# Patient Record
Sex: Male | Born: 1950 | Race: White | Hispanic: No | Marital: Married | Smoking: Never smoker
Health system: Southern US, Community
[De-identification: ages and names within clinical notes are randomized; demographics above are authoritative.]

## PROBLEM LIST (undated history)

## (undated) DIAGNOSIS — N200 Calculus of kidney: Secondary | ICD-10-CM

## (undated) DIAGNOSIS — I10 Essential (primary) hypertension: Secondary | ICD-10-CM

---

## 2007-12-10 ENCOUNTER — Encounter: Admission: RE | Admit: 2007-12-10 | Discharge: 2007-12-10 | Payer: Self-pay | Admitting: Orthopedic Surgery

## 2008-04-30 ENCOUNTER — Encounter: Admission: RE | Admit: 2008-04-30 | Discharge: 2008-04-30 | Payer: Self-pay | Admitting: Orthopedic Surgery

## 2009-04-29 ENCOUNTER — Encounter: Admission: RE | Admit: 2009-04-29 | Discharge: 2009-04-29 | Payer: Self-pay | Admitting: Urology

## 2011-03-08 ENCOUNTER — Other Ambulatory Visit: Payer: Self-pay | Admitting: Internal Medicine

## 2011-03-08 DIAGNOSIS — N281 Cyst of kidney, acquired: Secondary | ICD-10-CM

## 2011-05-03 ENCOUNTER — Ambulatory Visit
Admission: RE | Admit: 2011-05-03 | Discharge: 2011-05-03 | Disposition: A | Discharge status: Home / Self Care | Payer: Managed Care, Other (non HMO) | Source: Ambulatory Visit | Attending: Internal Medicine | Admitting: Internal Medicine

## 2011-05-03 DIAGNOSIS — N281 Cyst of kidney, acquired: Secondary | ICD-10-CM

## 2013-01-04 ENCOUNTER — Emergency Department (HOSPITAL_COMMUNITY)
Admission: EM | Admit: 2013-01-04 | Discharge: 2013-01-04 | Disposition: A | Discharge status: Home Health | Payer: Managed Care, Other (non HMO) | Attending: Emergency Medicine | Admitting: Emergency Medicine

## 2013-01-04 ENCOUNTER — Encounter (HOSPITAL_COMMUNITY): Payer: Self-pay | Admitting: Nurse Practitioner

## 2013-01-04 DIAGNOSIS — R110 Nausea: Secondary | ICD-10-CM | POA: Insufficient documentation

## 2013-01-04 DIAGNOSIS — N200 Calculus of kidney: Secondary | ICD-10-CM

## 2013-01-04 DIAGNOSIS — R6883 Chills (without fever): Secondary | ICD-10-CM | POA: Insufficient documentation

## 2013-01-04 DIAGNOSIS — R34 Anuria and oliguria: Secondary | ICD-10-CM | POA: Insufficient documentation

## 2013-01-04 DIAGNOSIS — M549 Dorsalgia, unspecified: Secondary | ICD-10-CM | POA: Insufficient documentation

## 2013-01-04 DIAGNOSIS — R319 Hematuria, unspecified: Secondary | ICD-10-CM

## 2013-01-04 DIAGNOSIS — I10 Essential (primary) hypertension: Secondary | ICD-10-CM | POA: Insufficient documentation

## 2013-01-04 HISTORY — DX: Calculus of kidney: N20.0

## 2013-01-04 HISTORY — DX: Essential (primary) hypertension: I10

## 2013-01-04 LAB — CBC WITH DIFFERENTIAL/PLATELET
Basophils Absolute: 0 10*3/uL (ref 0.0–0.1)
Eosinophils Absolute: 0 10*3/uL (ref 0.0–0.7)
Eosinophils Relative: 1 % (ref 0–5)
HCT: 41.4 % (ref 39.0–52.0)
Lymphocytes Relative: 20 % (ref 12–46)
Lymphs Abs: 1.2 10*3/uL (ref 0.7–4.0)
MCH: 30.5 pg (ref 26.0–34.0)
MCV: 87.2 fL (ref 78.0–100.0)
Monocytes Absolute: 0.5 10*3/uL (ref 0.1–1.0)
RDW: 13.4 % (ref 11.5–15.5)
WBC: 6.1 10*3/uL (ref 4.0–10.5)

## 2013-01-04 LAB — URINALYSIS, MICROSCOPIC ONLY
Nitrite: NEGATIVE
Protein, ur: NEGATIVE mg/dL
Urobilinogen, UA: 0.2 mg/dL (ref 0.0–1.0)

## 2013-01-04 LAB — COMPREHENSIVE METABOLIC PANEL
CO2: 24 mEq/L (ref 19–32)
Calcium: 9.6 mg/dL (ref 8.4–10.5)
Creatinine, Ser: 0.88 mg/dL (ref 0.50–1.35)
GFR calc Af Amer: >90 mL/min (ref >90)
GFR calc non Af Amer: >90 mL/min (ref >90)
Glucose, Bld: 138 mg/dL — ABNORMAL HIGH (ref 70–99)
Total Protein: 7.7 g/dL (ref 6.0–8.3)

## 2013-01-04 MED ORDER — OXYCODONE-ACETAMINOPHEN 5-325 MG PO TABS: 1.0000 | ORAL_TABLET | Freq: Four times a day (QID) | ORAL | Status: AC | PRN

## 2013-01-04 MED ORDER — HYDROMORPHONE HCL PF 1 MG/ML IJ SOLN
1.0000 mg | Freq: Once | INTRAMUSCULAR | Status: AC
  Administered 2013-01-04: 1 mg via INTRAVENOUS
  Filled 2013-01-04: qty 1

## 2013-01-04 MED ORDER — SODIUM CHLORIDE 0.9 % IV BOLUS (SEPSIS)
1000.0000 mL | Freq: Once | INTRAVENOUS | Status: AC
  Administered 2013-01-04: 1000 mL via INTRAVENOUS

## 2013-01-04 MED ORDER — ONDANSETRON HCL 4 MG PO TABS: 4.0000 mg | ORAL_TABLET | Freq: Four times a day (QID) | ORAL | Status: AC

## 2013-01-04 MED ORDER — ONDANSETRON HCL 4 MG/2ML IJ SOLN
4.0000 mg | Freq: Once | INTRAMUSCULAR | Status: AC
  Administered 2013-01-04: 4 mg via INTRAVENOUS
  Filled 2013-01-04: qty 2

## 2013-01-04 NOTE — ED Notes (Signed)
Pt woke this am with R flank pain now radiating into RLQ, "Feels like when I had a kidney stone."

## 2013-01-04 NOTE — ED Provider Notes (Signed)
History     CSN: [id number]  Arrival date & time 01/04/13  1001   First MD Initiated Contact with Patient 01/04/13 1008      Chief Complaint  Patient presents with  . Flank Pain    (Consider location/radiation/quality/duration/timing/severity/associated sxs/prior treatment) HPI Comments: 62 y/o male with history of kidney stones, hypertension, and right renal pole cyst presents to the emergency department today with complaints of right mid back pain that awoke him this morning. Pain is radiating around to right flank and down into RLQ and groin region. Pain is waxing and waning, described as sharp and stabbing.  He has not tried anything for the pain and nothing makes it worse.  He has some associated nausea and chills, and reported low volume urine output this am.  Pain is a 6/10 currently, and has been an 8/10 at worst.  Denies fever, vomiting, diarrhea, dysuria, burning with urination, visible hematuria, or testicular tenderness/swelling. Sees urology at [facility], last seen about 6 months ago for his enlarged prostate. Has not had a kidney stone in about 4 years.  Patient is a 62 y.o. male presenting with flank pain. The history is provided by the patient.  Flank Pain Associated symptoms include abdominal pain, chills and nausea. Pertinent negatives include no chest pain, diaphoresis, fever or vomiting.    Past Medical History  Diagnosis Date  . Kidney stone   . Hypertension     History reviewed. No pertinent past surgical history.  History reviewed. No pertinent family history.  History  Substance Use Topics  . Smoking status: Never Smoker   . Smokeless tobacco: Not on file  . Alcohol Use: Yes      Review of Systems  Constitutional: Positive for chills. Negative for fever and diaphoresis.  Respiratory: Negative for shortness of breath.   Cardiovascular: Negative for chest pain.  Gastrointestinal: Positive for nausea and abdominal pain. Negative for vomiting,  diarrhea, constipation, blood in stool and abdominal distention.       R flank and RLQ tenderness.  Genitourinary: Positive for flank pain and decreased urine volume. Negative for dysuria, urgency, frequency, hematuria, scrotal swelling and testicular pain.  Musculoskeletal: Positive for back pain.       Chronic L4/L5 fusion pain.  All other systems reviewed and are negative.    Allergies  Penicillins  Home Medications  No current outpatient prescriptions on file.  BP 138/72  Pulse 63  Temp(Src) 98.1 F (36.7 C) (Oral)  Resp 16  SpO2 99%  Physical Exam  Nursing note and vitals reviewed. Constitutional: He is oriented to person, place, and time. He appears well-developed and well-nourished.  Appears uncomfortable.  HENT:  Head: Normocephalic and atraumatic.  Mouth/Throat: Oropharynx is clear and moist.  Eyes: Conjunctivae and EOM are normal. Pupils are equal, round, and reactive to light.  Neck: Normal range of motion. Neck supple.  Cardiovascular: Normal rate, regular rhythm, normal heart sounds and intact distal pulses.  Exam reveals no gallop and no friction rub.   No murmur heard. Pulmonary/Chest: Effort normal and breath sounds normal. No respiratory distress.  Abdominal: Soft. Bowel sounds are normal. He exhibits no distension and no mass. There is tenderness. There is CVA tenderness (right). There is no rigidity, no rebound, no guarding and negative Murphy's sign.    RLQ/RUQ TTP. Neg Murphy's sign.   Musculoskeletal: Normal range of motion. He exhibits tenderness.  Right CVAT present.  Neurological: He is alert and oriented to person, place, and time.  Skin: Skin  is warm and dry. He is not diaphoretic.  Psychiatric: He has a normal mood and affect. His behavior is normal.    ED Course  Procedures (including critical care time)  Labs Reviewed  COMPREHENSIVE METABOLIC PANEL - Abnormal; Notable for the following:    Glucose, Bld 138 (*)    All other components  within normal limits  URINALYSIS, MICROSCOPIC ONLY - Abnormal; Notable for the following:    APPearance CLOUDY (*)    Hgb urine dipstick LARGE (*)    Leukocytes, UA SMALL (*)    All other components within normal limits  CBC WITH DIFFERENTIAL  LIPASE, BLOOD   No results found.   1. Kidney stone   2. Hematuria       MDM  62 y/o male with known kidney stones presenting with flank pain. U/A with large hgb and no infection. He has a urologist at [facility] I do not feel CT scan is necessary at this time due to patient's history. He is more comfortable with dilaudid and zofran. Will discharge with urine strainer, percocet and zofran. Return precautions discussed. Patient states understanding of plan and is agreeable.         Trevor Mace, PA-C 01/04/13 1119

## 2013-01-04 NOTE — ED Notes (Signed)
Patient discharged to home with wife. NAD.  

## 2013-01-04 NOTE — ED Provider Notes (Signed)
Medical screening examination/treatment/procedure(s) were performed by non-physician practitioner and as supervising physician I was immediately available for consultation/collaboration.    Celene Kras, MD 01/04/13 1125

## 2013-02-05 ENCOUNTER — Other Ambulatory Visit: Payer: Self-pay | Admitting: Internal Medicine

## 2013-02-05 DIAGNOSIS — N281 Cyst of kidney, acquired: Secondary | ICD-10-CM

## 2013-02-11 ENCOUNTER — Ambulatory Visit
Admission: RE | Admit: 2013-02-11 | Discharge: 2013-02-11 | Disposition: A | Discharge status: Home / Self Care | Payer: Managed Care, Other (non HMO) | Source: Ambulatory Visit | Attending: Internal Medicine | Admitting: Internal Medicine

## 2013-02-11 DIAGNOSIS — N281 Cyst of kidney, acquired: Secondary | ICD-10-CM

## 2014-04-23 ENCOUNTER — Other Ambulatory Visit: Payer: Self-pay | Admitting: Internal Medicine

## 2014-04-23 DIAGNOSIS — N281 Cyst of kidney, acquired: Secondary | ICD-10-CM

## 2014-11-17 IMAGING — US US RENAL
1 series · 14 of 25 positions shown · non-contrast
Comparison: Lumbar spine MR 01/03/2013.  Renal ultrasound
05/03/2011.

CLINICAL DATA: Renal cyst.

RENAL/URINARY TRACT ULTRASOUND COMPLETE

[Series 1: us renal · 0.29mm/px · 14 of 34 slices shown]
[im 1/34]
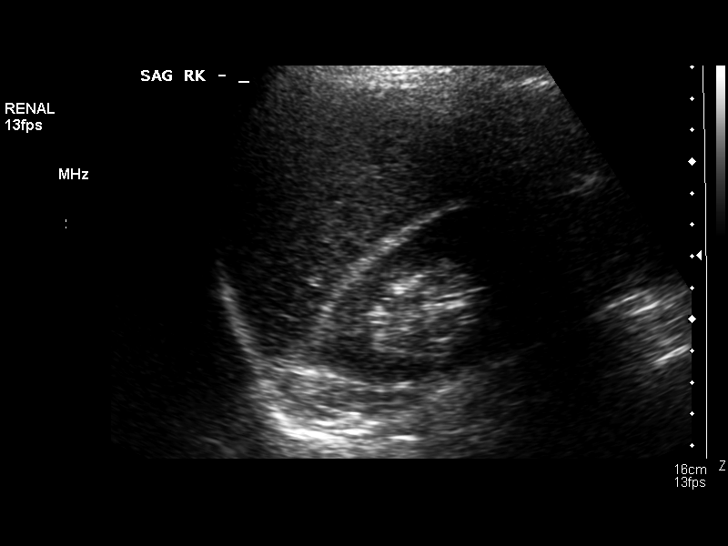
[im 3/34]
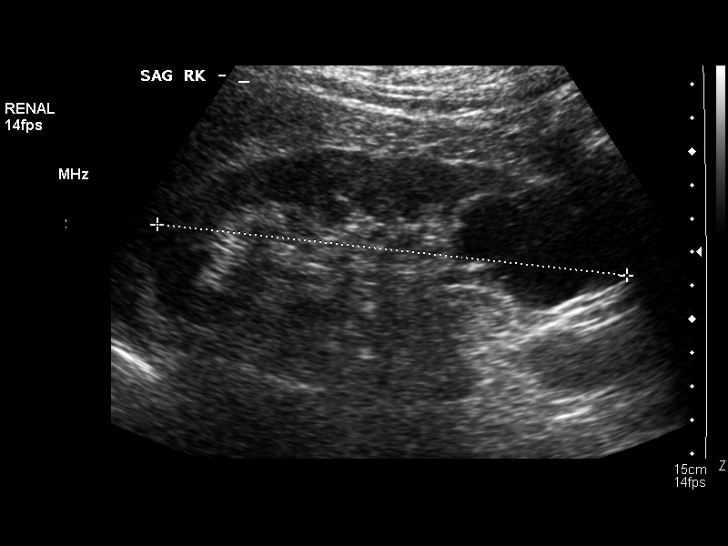
[im 6/34]
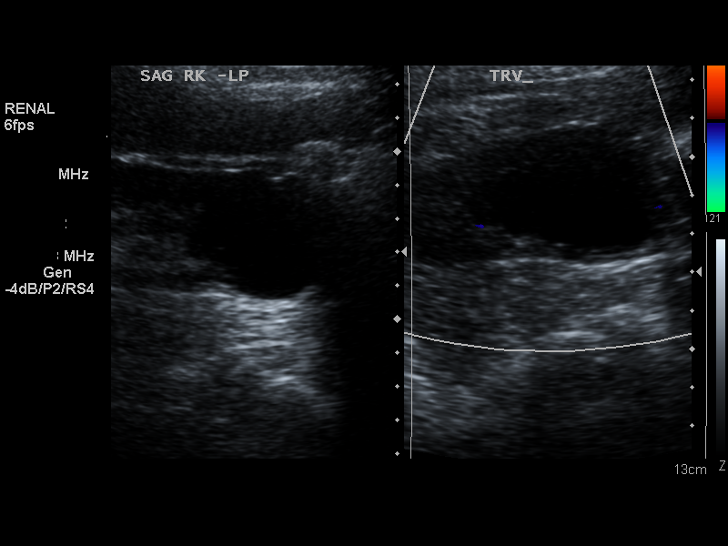
[im 9/34]
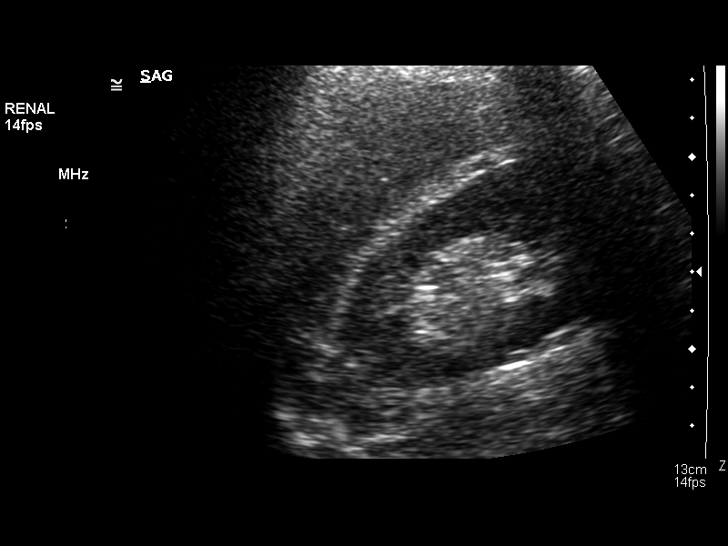
[im 12/34]
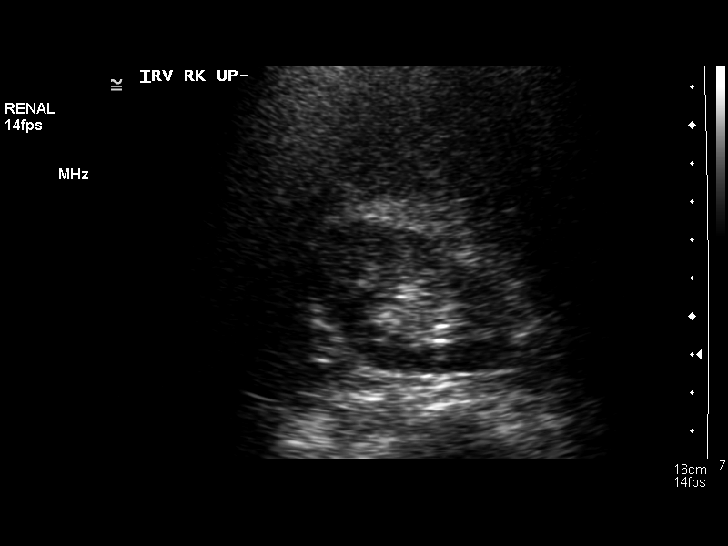
[im 13/34]
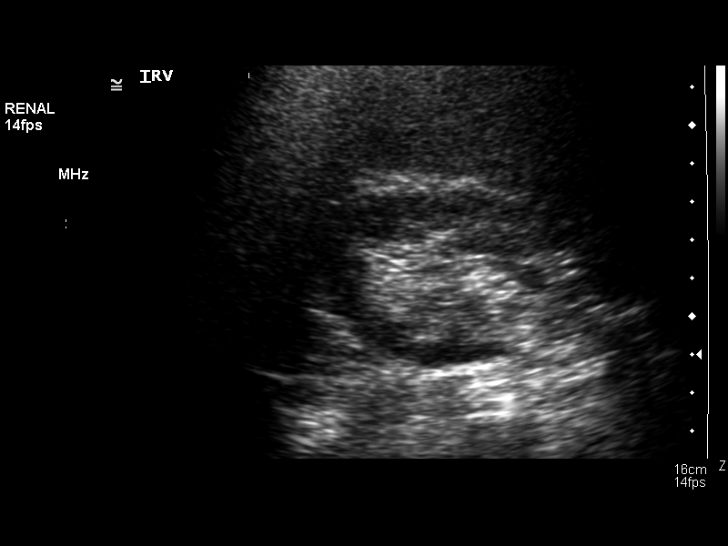
[im 16/34]
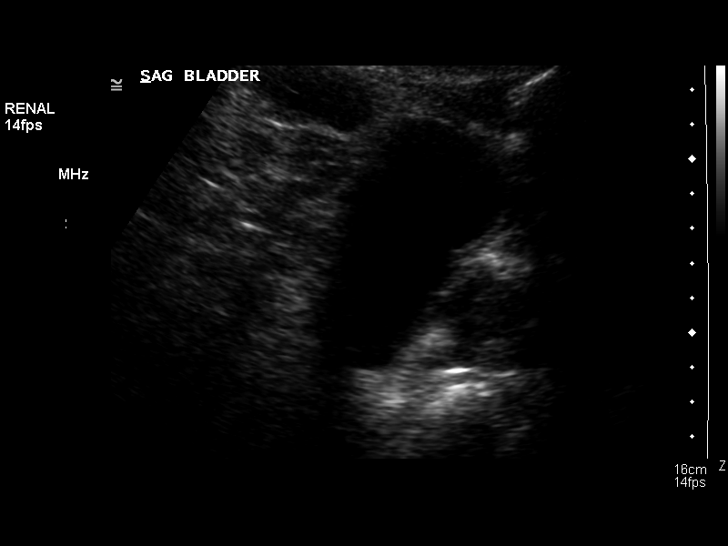
[im 18/34]
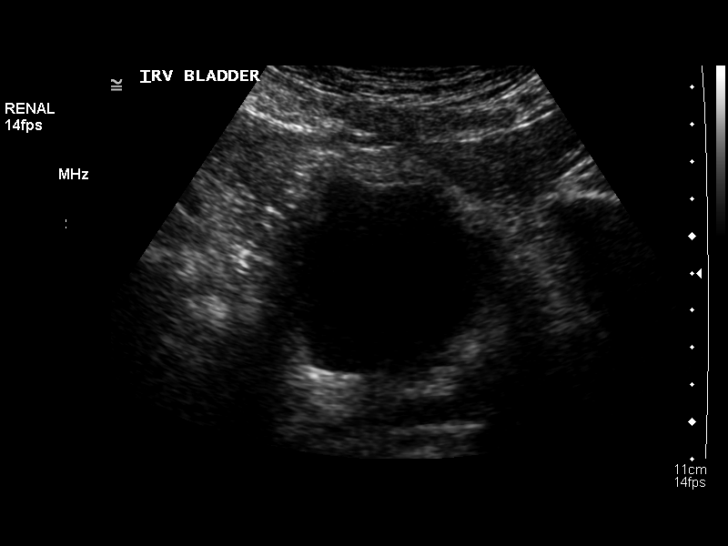
[im 21/34]
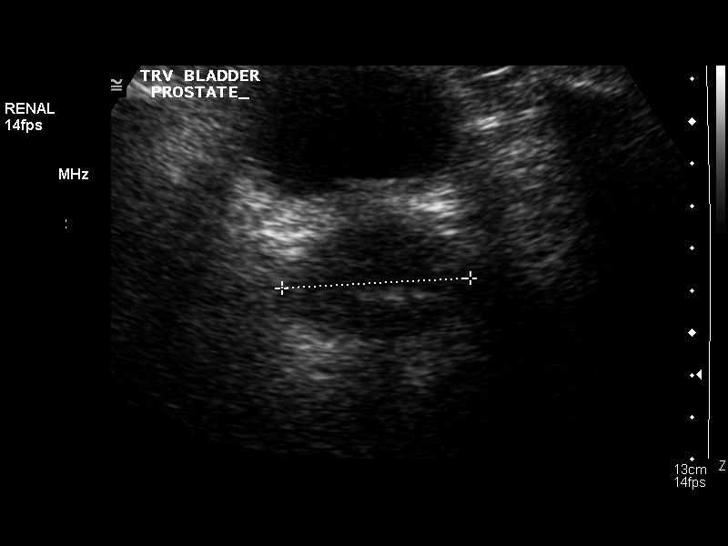
[im 23/34]
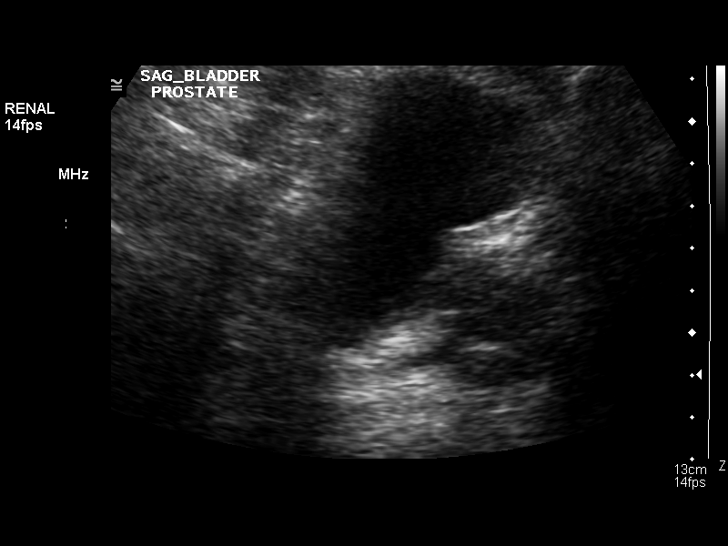
[im 25/34]
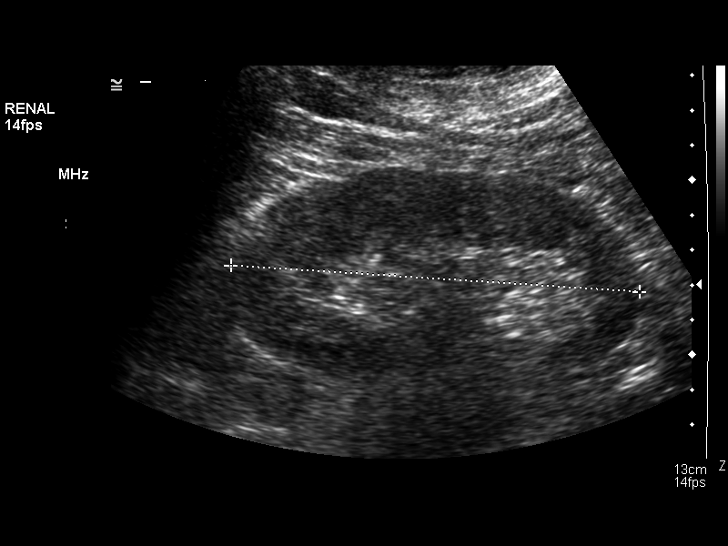
[im 28/34]
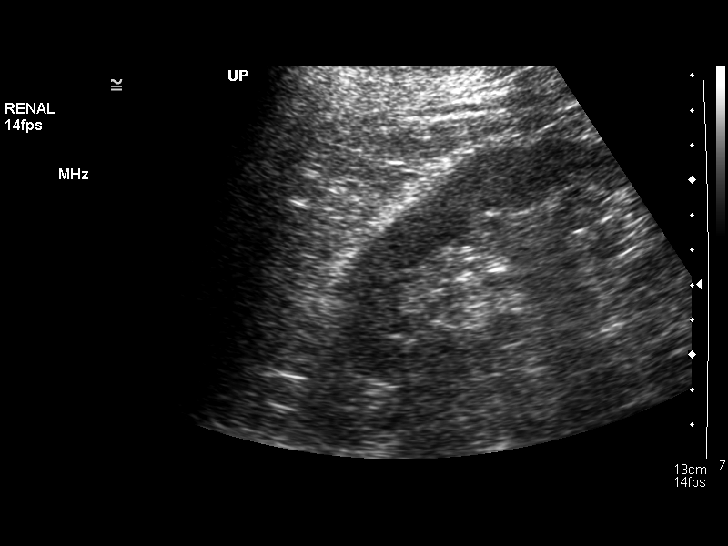
[im 31/34]
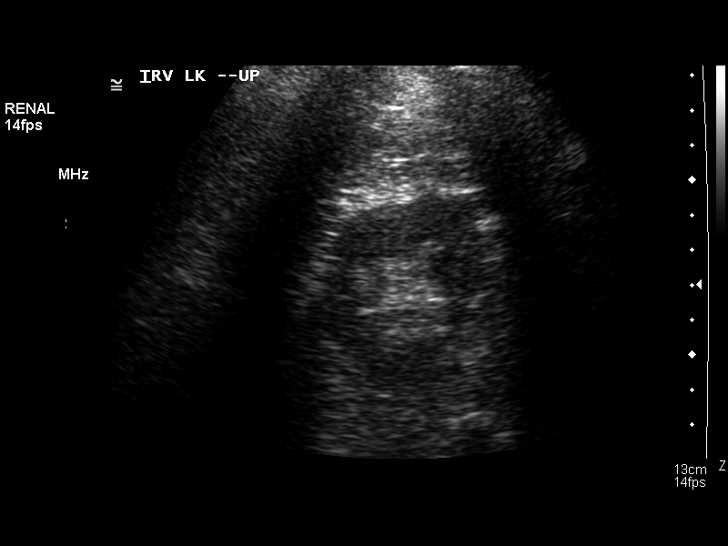
[im 34/34]
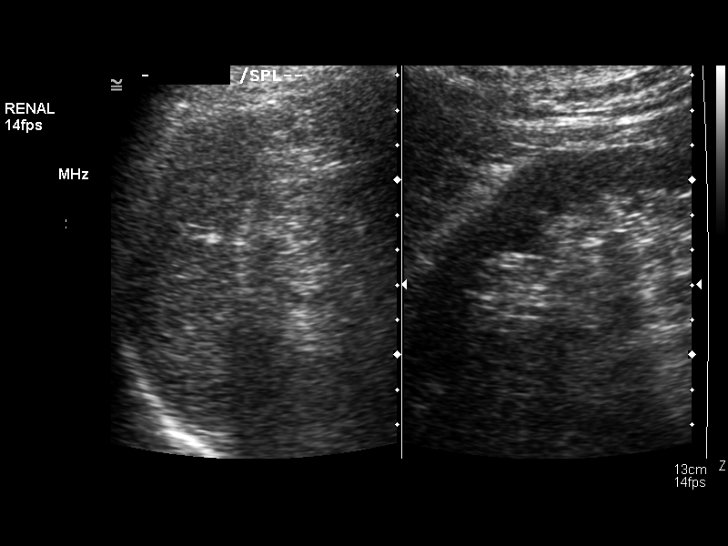

[14 of 25 positions shown; findings below may reference images not displayed]

FINDINGS: Right Kidney:  14.1 cm.  There is a cyst in the lower pole of the
right kidney which measures 5.1 x 4.3 x 3.7 cm.  The previously
seen internal septations not as well visualized on today's study.
Overall size is similar to prior ultrasound.

Left Kidney:  11.7 cm. Normal size and echotexture.  No focal
abnormality.  No hydronephrosis.

Bladder:  Grossly unremarkable appearance.
IMPRESSION: 5.1 cm right lower pole cyst, not significantly changed in size or
overall appearance since prior study.  The previously noted
internal septation not as well visualized but likely remains
present.

## 2015-01-21 ENCOUNTER — Other Ambulatory Visit: Payer: Self-pay | Admitting: Specialist

## 2015-01-21 DIAGNOSIS — M545 Low back pain, unspecified: Secondary | ICD-10-CM

## 2015-01-22 ENCOUNTER — Ambulatory Visit
Admission: RE | Admit: 2015-01-22 | Discharge: 2015-01-22 | Disposition: A | Discharge status: Home / Self Care | Payer: Managed Care, Other (non HMO) | Source: Ambulatory Visit | Attending: Specialist | Admitting: Specialist

## 2015-01-22 DIAGNOSIS — M545 Low back pain, unspecified: Secondary | ICD-10-CM

## 2015-01-22 MED ORDER — METHYLPREDNISOLONE ACETATE 40 MG/ML INJ SUSP (RADIOLOG
120.0000 mg | Freq: Once | INTRAMUSCULAR | Status: AC
  Administered 2015-01-22: 120 mg via EPIDURAL

## 2015-01-22 MED ORDER — IOHEXOL 180 MG/ML  SOLN
1.0000 mL | Freq: Once | INTRAMUSCULAR | Status: AC | PRN
  Administered 2015-01-22: 1 mL via EPIDURAL

## 2015-01-22 NOTE — Discharge Instructions (Signed)

## 2015-01-25 ENCOUNTER — Other Ambulatory Visit: Payer: Managed Care, Other (non HMO)

## 2015-05-04 ENCOUNTER — Ambulatory Visit
Admission: RE | Admit: 2015-05-04 | Discharge: 2015-05-04 | Disposition: A | Discharge status: Home / Self Care | Payer: 59 | Source: Ambulatory Visit | Attending: Specialist | Admitting: Specialist

## 2015-05-04 ENCOUNTER — Other Ambulatory Visit: Payer: Self-pay | Admitting: Specialist

## 2015-05-04 ENCOUNTER — Other Ambulatory Visit: Payer: Managed Care, Other (non HMO)

## 2015-05-04 DIAGNOSIS — M48061 Spinal stenosis, lumbar region without neurogenic claudication: Secondary | ICD-10-CM

## 2015-05-04 MED ORDER — IOHEXOL 180 MG/ML  SOLN
1.0000 mL | Freq: Once | INTRAMUSCULAR | Status: AC | PRN
  Administered 2015-05-04: 1 mL via EPIDURAL

## 2015-05-04 MED ORDER — METHYLPREDNISOLONE ACETATE 40 MG/ML INJ SUSP (RADIOLOG
120.0000 mg | Freq: Once | INTRAMUSCULAR | Status: AC
  Administered 2015-05-04: 120 mg via EPIDURAL

## 2015-07-19 ENCOUNTER — Other Ambulatory Visit: Payer: Self-pay | Admitting: Internal Medicine

## 2015-07-19 DIAGNOSIS — N281 Cyst of kidney, acquired: Secondary | ICD-10-CM

## 2015-08-31 ENCOUNTER — Ambulatory Visit
Admission: RE | Admit: 2015-08-31 | Discharge: 2015-08-31 | Disposition: A | Discharge status: Home / Self Care | Payer: 59 | Source: Ambulatory Visit | Attending: Internal Medicine | Admitting: Internal Medicine

## 2015-08-31 DIAGNOSIS — N281 Cyst of kidney, acquired: Secondary | ICD-10-CM

## 2017-06-05 IMAGING — US US RENAL
1 series · 14 of 25 positions shown · non-contrast
Comparison: Renal ultrasound 02/11/2013.

CLINICAL DATA: Renal cyst.

EXAM:
RENAL / URINARY TRACT ULTRASOUND COMPLETE

[Series 1: us renal · 0.28mm/px · 14 of 40 slices shown]
[im 1/40]
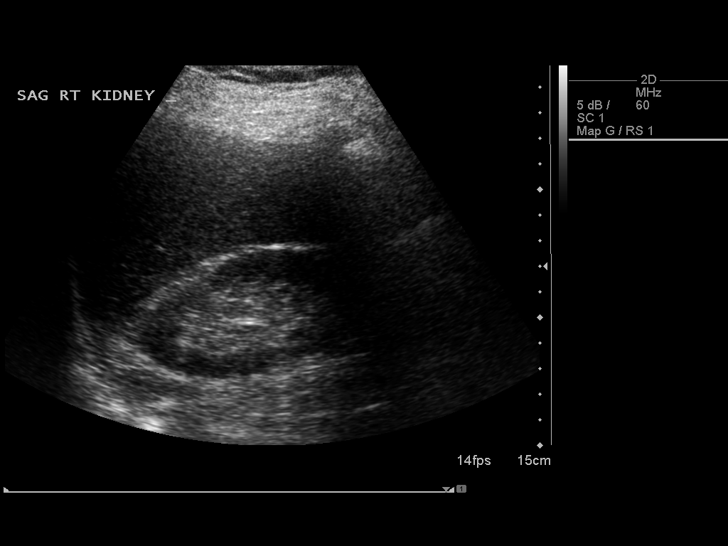
[im 4/40]
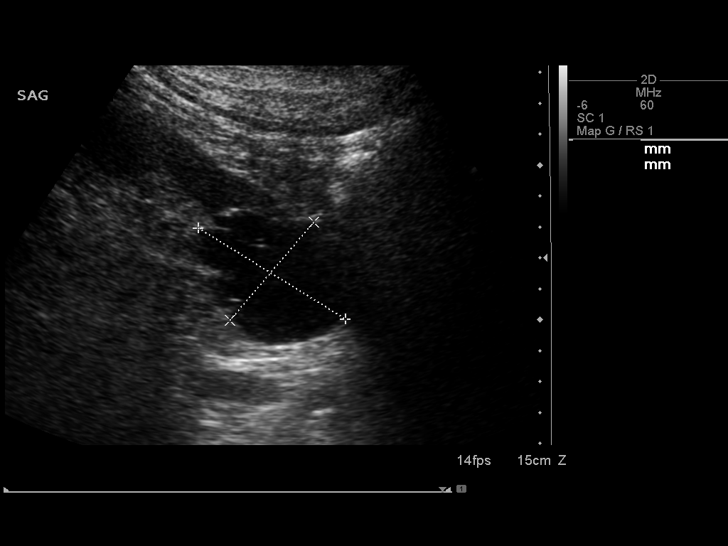
[im 7/40]
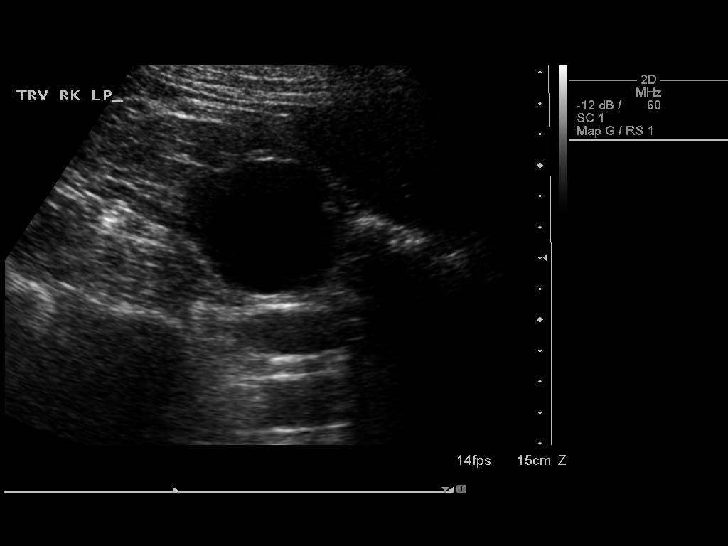
[im 10/40]
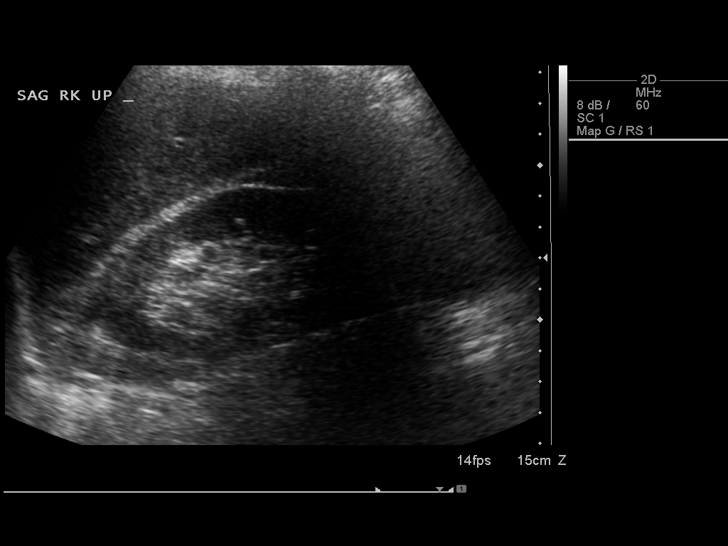
[im 14/40]
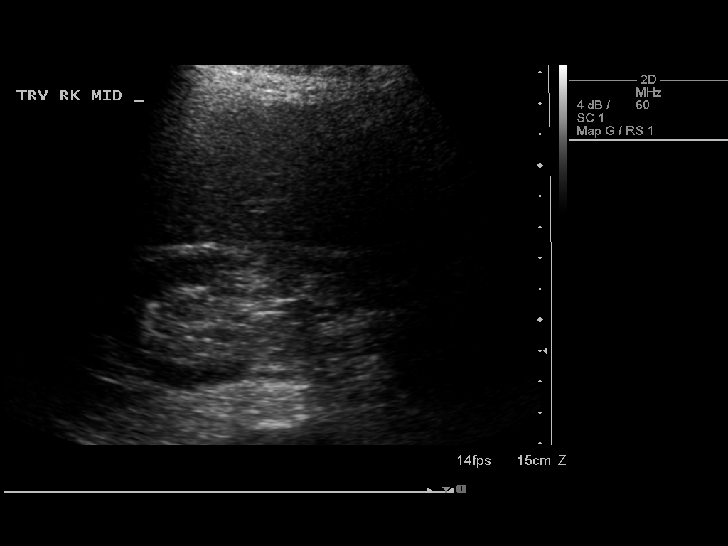
[im 15/40]
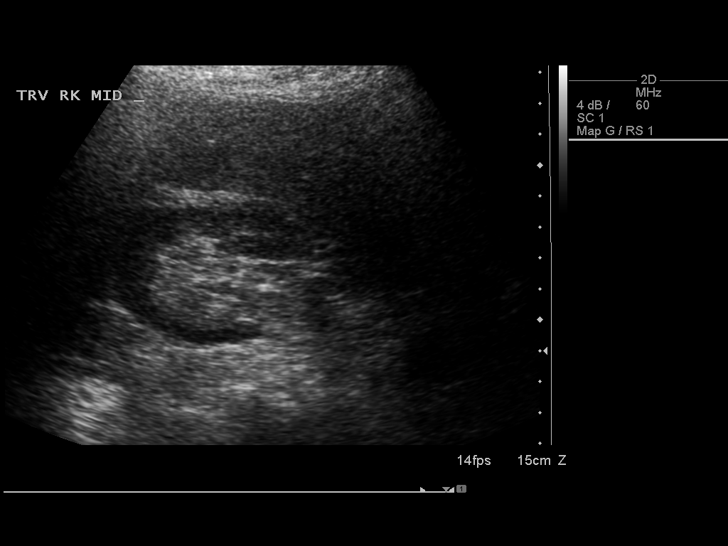
[im 18/40]
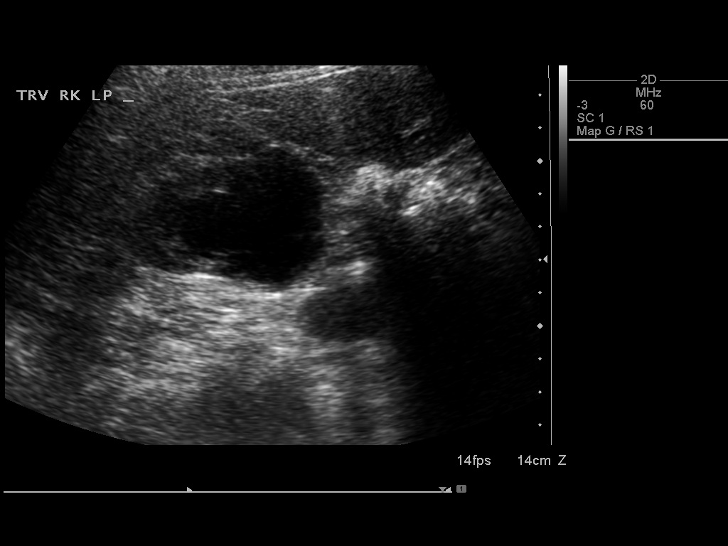
[im 22/40]
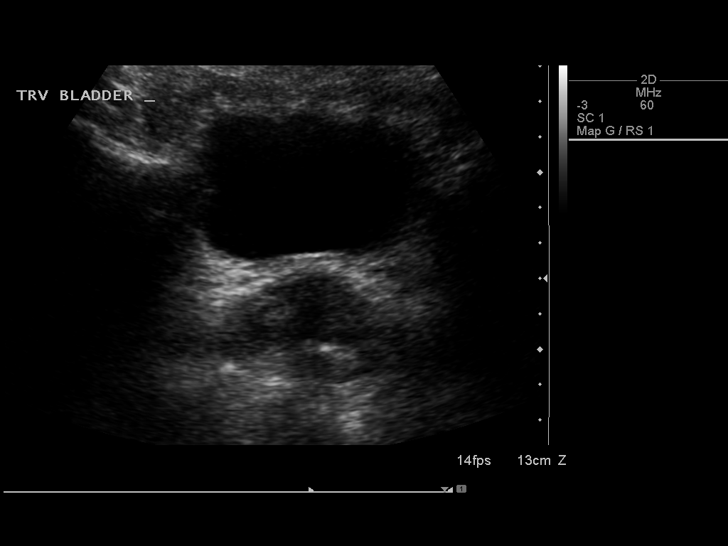
[im 25/40]
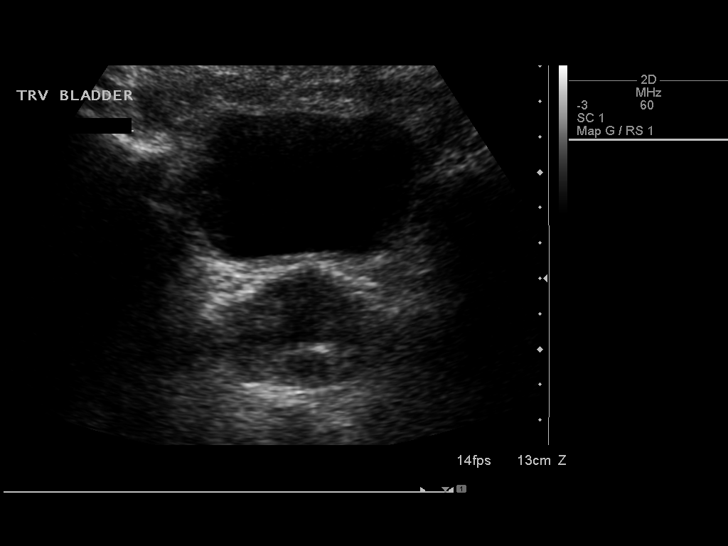
[im 27/40]
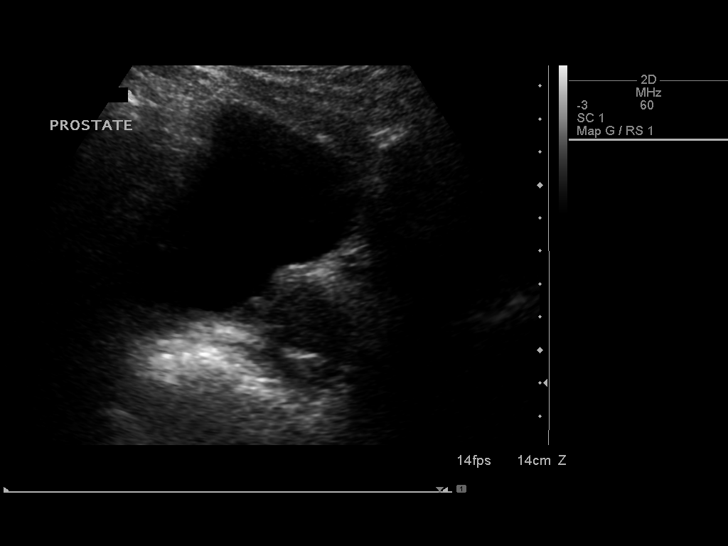
[im 30/40]
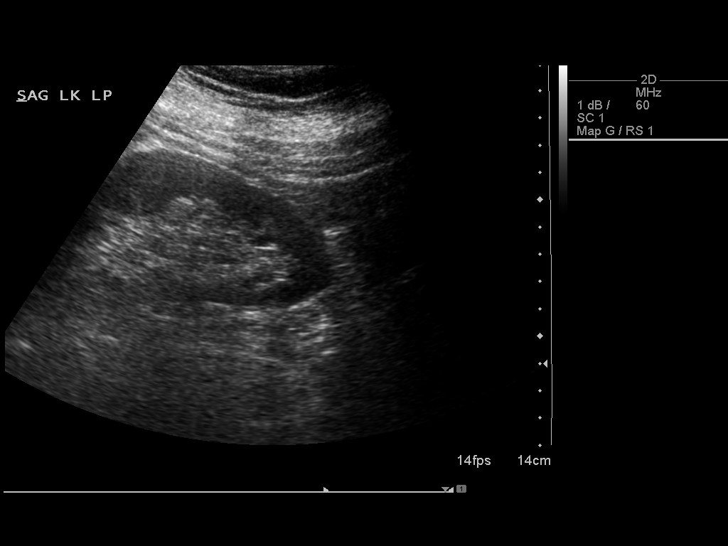
[im 33/40]
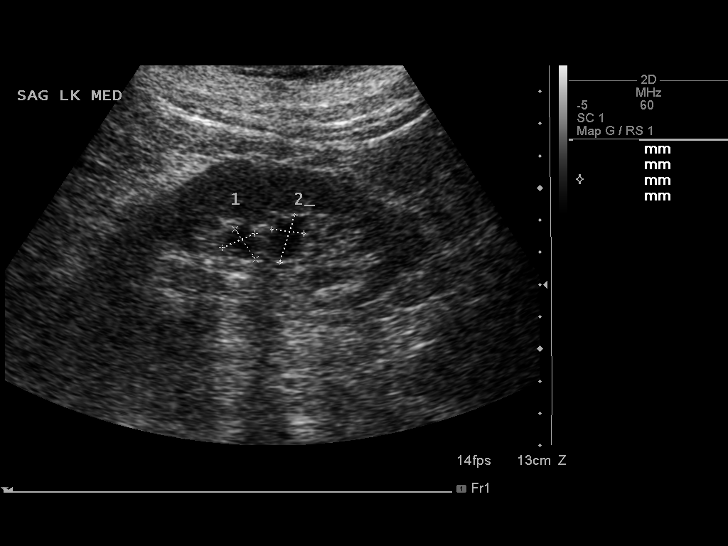
[im 36/40]
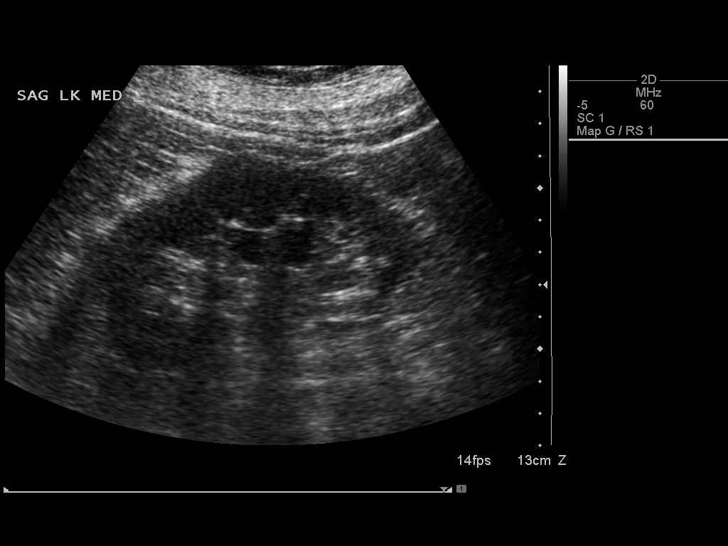
[im 40/40]
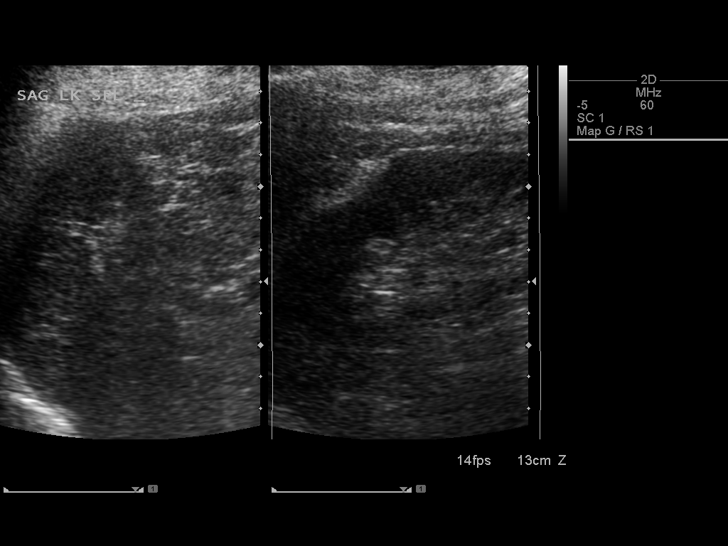

[14 of 25 positions shown; findings below may reference images not displayed]

FINDINGS: Right Kidney:

Length: 14.0 cm, within normal limits. The right kidney is of normal
size and echotexture. A lower pole cyst is slightly increased in
size, now measuring 5.6 x 4.2 x 5.1 cm. Minimal internal septations
are again seen.

Left Kidney:

Length: 11.5 cm, within normal limits.. The left kidney is of normal
echogenicity. 1.5 cm parapelvic cysts are noted. No other mass
lesion or stone is present. There is no hydronephrosis.

Bladder:

Normal
IMPRESSION: 1. Slight increase in size of simple cyst at the lower pole of the
right kidney.
2. Otherwise normal renal ultrasound.

## 2018-01-23 DIAGNOSIS — M79672 Pain in left foot: Secondary | ICD-10-CM | POA: Diagnosis not present

## 2018-02-06 DIAGNOSIS — M79672 Pain in left foot: Secondary | ICD-10-CM | POA: Diagnosis not present

## 2018-02-11 DIAGNOSIS — Z1211 Encounter for screening for malignant neoplasm of colon: Secondary | ICD-10-CM | POA: Diagnosis not present

## 2018-02-11 DIAGNOSIS — Z1212 Encounter for screening for malignant neoplasm of rectum: Secondary | ICD-10-CM | POA: Diagnosis not present

## 2018-02-15 DIAGNOSIS — M65879 Other synovitis and tenosynovitis, unspecified ankle and foot: Secondary | ICD-10-CM | POA: Diagnosis not present

## 2018-02-15 DIAGNOSIS — M79672 Pain in left foot: Secondary | ICD-10-CM | POA: Diagnosis not present

## 2018-04-19 DIAGNOSIS — L918 Other hypertrophic disorders of the skin: Secondary | ICD-10-CM | POA: Diagnosis not present

## 2018-04-19 DIAGNOSIS — L578 Other skin changes due to chronic exposure to nonionizing radiation: Secondary | ICD-10-CM | POA: Diagnosis not present

## 2018-05-01 DIAGNOSIS — Z20828 Contact with and (suspected) exposure to other viral communicable diseases: Secondary | ICD-10-CM | POA: Diagnosis not present

## 2018-06-11 DIAGNOSIS — R899 Unspecified abnormal finding in specimens from other organs, systems and tissues: Secondary | ICD-10-CM | POA: Diagnosis not present

## 2018-06-14 DIAGNOSIS — M25561 Pain in right knee: Secondary | ICD-10-CM | POA: Diagnosis not present

## 2018-06-17 DIAGNOSIS — I10 Essential (primary) hypertension: Secondary | ICD-10-CM | POA: Diagnosis not present

## 2018-08-02 DIAGNOSIS — Z23 Encounter for immunization: Secondary | ICD-10-CM | POA: Diagnosis not present

## 2018-08-05 DIAGNOSIS — I10 Essential (primary) hypertension: Secondary | ICD-10-CM | POA: Diagnosis not present

## 2018-09-10 DIAGNOSIS — I10 Essential (primary) hypertension: Secondary | ICD-10-CM | POA: Diagnosis not present

## 2018-10-31 DIAGNOSIS — I10 Essential (primary) hypertension: Secondary | ICD-10-CM | POA: Diagnosis not present

## 2018-10-31 DIAGNOSIS — Z125 Encounter for screening for malignant neoplasm of prostate: Secondary | ICD-10-CM | POA: Diagnosis not present

## 2018-10-31 DIAGNOSIS — Z0000 Encounter for general adult medical examination without abnormal findings: Secondary | ICD-10-CM | POA: Diagnosis not present

## 2018-10-31 DIAGNOSIS — E7800 Pure hypercholesterolemia, unspecified: Secondary | ICD-10-CM | POA: Diagnosis not present

## 2018-10-31 DIAGNOSIS — Z1159 Encounter for screening for other viral diseases: Secondary | ICD-10-CM | POA: Diagnosis not present

## 2018-11-04 DIAGNOSIS — M545 Low back pain: Secondary | ICD-10-CM | POA: Diagnosis not present

## 2018-11-04 DIAGNOSIS — J309 Allergic rhinitis, unspecified: Secondary | ICD-10-CM | POA: Diagnosis not present

## 2018-11-04 DIAGNOSIS — Z0000 Encounter for general adult medical examination without abnormal findings: Secondary | ICD-10-CM | POA: Diagnosis not present

## 2018-11-04 DIAGNOSIS — Z1212 Encounter for screening for malignant neoplasm of rectum: Secondary | ICD-10-CM | POA: Diagnosis not present

## 2018-11-04 DIAGNOSIS — E559 Vitamin D deficiency, unspecified: Secondary | ICD-10-CM | POA: Diagnosis not present

## 2018-11-04 DIAGNOSIS — R292 Abnormal reflex: Secondary | ICD-10-CM | POA: Diagnosis not present

## 2018-11-04 DIAGNOSIS — I700 Atherosclerosis of aorta: Secondary | ICD-10-CM | POA: Diagnosis not present

## 2018-11-04 DIAGNOSIS — R202 Paresthesia of skin: Secondary | ICD-10-CM | POA: Diagnosis not present

## 2018-11-04 DIAGNOSIS — N200 Calculus of kidney: Secondary | ICD-10-CM | POA: Diagnosis not present

## 2018-11-04 DIAGNOSIS — I10 Essential (primary) hypertension: Secondary | ICD-10-CM | POA: Diagnosis not present

## 2018-11-04 DIAGNOSIS — E7800 Pure hypercholesterolemia, unspecified: Secondary | ICD-10-CM | POA: Diagnosis not present

## 2018-12-09 DIAGNOSIS — H2513 Age-related nuclear cataract, bilateral: Secondary | ICD-10-CM | POA: Diagnosis not present

## 2018-12-09 DIAGNOSIS — H5213 Myopia, bilateral: Secondary | ICD-10-CM | POA: Diagnosis not present

## 2018-12-09 DIAGNOSIS — H353131 Nonexudative age-related macular degeneration, bilateral, early dry stage: Secondary | ICD-10-CM | POA: Diagnosis not present

## 2019-06-27 DIAGNOSIS — Z23 Encounter for immunization: Secondary | ICD-10-CM | POA: Diagnosis not present

## 2019-11-19 ENCOUNTER — Ambulatory Visit: Payer: 59

## 2019-11-20 ENCOUNTER — Ambulatory Visit: Payer: 59

## 2019-11-24 ENCOUNTER — Ambulatory Visit: Payer: 59

## 2019-11-28 ENCOUNTER — Ambulatory Visit: Payer: Medicare Other | Attending: Internal Medicine

## 2019-11-28 DIAGNOSIS — Z23 Encounter for immunization: Secondary | ICD-10-CM

## 2019-11-28 NOTE — Progress Notes (Signed)
   Covid-19 Vaccination Clinic  Name:  Larry Hawkins    MRN: [id number] DOB: 1950/12/28  11/28/2019  Larry Hawkins was observed post Covid-19 immunization for 15 minutes without incidence. He was provided with Vaccine Information Sheet and instruction to access the V-Safe system.   Larry Hawkins was instructed to call 911 with any severe reactions post vaccine: Marland Kitchen Difficulty breathing  . Swelling of your face and throat  . A fast heartbeat  . A bad rash all over your body  . Dizziness and weakness    Immunizations Administered    Name Date Dose VIS Date Route   Pfizer COVID-19 Vaccine 11/28/2019  8:45 AM 0.3 mL 10/03/2019 Intramuscular   Manufacturer: [employer], [employer]   Lot: [id number]   NDC: [address]-6

## 2019-12-10 ENCOUNTER — Ambulatory Visit: Payer: 59

## 2019-12-15 ENCOUNTER — Ambulatory Visit: Payer: 59

## 2019-12-23 ENCOUNTER — Ambulatory Visit: Payer: Medicare Other | Attending: Internal Medicine

## 2019-12-23 DIAGNOSIS — Z23 Encounter for immunization: Secondary | ICD-10-CM

## 2019-12-23 NOTE — Progress Notes (Signed)
   Covid-19 Vaccination Clinic  Name:  Larry Hawkins    MRN: [id number] DOB: [birth year]  12/23/2019  Larry Hawkins was observed post Covid-19 immunization for 15 minutes without incident. He was provided with Vaccine Information Sheet and instruction to access the V-Safe system.   Larry Hawkins was instructed to call 911 with any severe reactions post vaccine: Marland Kitchen Difficulty breathing  . Swelling of face and throat  . A fast heartbeat  . A bad rash all over body  . Dizziness and weakness   Immunizations Administered    Name Date Dose VIS Date Route   Pfizer COVID-19 Vaccine 12/23/2019  9:20 AM 0.3 mL 10/03/2019 Intramuscular   Manufacturer: [employer], [employer]   Lot: [id number]   NDC: [address]-2

## 2020-03-11 DIAGNOSIS — Z125 Encounter for screening for malignant neoplasm of prostate: Secondary | ICD-10-CM | POA: Diagnosis not present

## 2020-03-11 DIAGNOSIS — I10 Essential (primary) hypertension: Secondary | ICD-10-CM | POA: Diagnosis not present

## 2020-03-12 DIAGNOSIS — H353131 Nonexudative age-related macular degeneration, bilateral, early dry stage: Secondary | ICD-10-CM | POA: Diagnosis not present

## 2020-03-12 DIAGNOSIS — H2513 Age-related nuclear cataract, bilateral: Secondary | ICD-10-CM | POA: Diagnosis not present

## 2020-03-12 DIAGNOSIS — H5213 Myopia, bilateral: Secondary | ICD-10-CM | POA: Diagnosis not present

## 2020-03-16 DIAGNOSIS — E559 Vitamin D deficiency, unspecified: Secondary | ICD-10-CM | POA: Diagnosis not present

## 2020-03-16 DIAGNOSIS — I700 Atherosclerosis of aorta: Secondary | ICD-10-CM | POA: Diagnosis not present

## 2020-03-16 DIAGNOSIS — Z0000 Encounter for general adult medical examination without abnormal findings: Secondary | ICD-10-CM | POA: Diagnosis not present

## 2020-03-16 DIAGNOSIS — J309 Allergic rhinitis, unspecified: Secondary | ICD-10-CM | POA: Diagnosis not present

## 2020-03-16 DIAGNOSIS — I10 Essential (primary) hypertension: Secondary | ICD-10-CM | POA: Diagnosis not present

## 2020-03-16 DIAGNOSIS — E7800 Pure hypercholesterolemia, unspecified: Secondary | ICD-10-CM | POA: Diagnosis not present

## 2020-03-16 DIAGNOSIS — R202 Paresthesia of skin: Secondary | ICD-10-CM | POA: Diagnosis not present

## 2020-03-16 DIAGNOSIS — M545 Low back pain: Secondary | ICD-10-CM | POA: Diagnosis not present

## 2020-03-16 DIAGNOSIS — N281 Cyst of kidney, acquired: Secondary | ICD-10-CM | POA: Diagnosis not present

## 2020-03-16 DIAGNOSIS — N401 Enlarged prostate with lower urinary tract symptoms: Secondary | ICD-10-CM | POA: Diagnosis not present

## 2020-03-16 DIAGNOSIS — R292 Abnormal reflex: Secondary | ICD-10-CM | POA: Diagnosis not present

## 2020-03-16 DIAGNOSIS — N200 Calculus of kidney: Secondary | ICD-10-CM | POA: Diagnosis not present

## 2020-07-14 DIAGNOSIS — Z23 Encounter for immunization: Secondary | ICD-10-CM | POA: Diagnosis not present

## 2020-07-26 DIAGNOSIS — Z7689 Persons encountering health services in other specified circumstances: Secondary | ICD-10-CM | POA: Diagnosis not present

## 2020-07-26 DIAGNOSIS — J329 Chronic sinusitis, unspecified: Secondary | ICD-10-CM | POA: Diagnosis not present

## 2020-10-04 DIAGNOSIS — R0789 Other chest pain: Secondary | ICD-10-CM | POA: Diagnosis not present

## 2020-10-04 DIAGNOSIS — J329 Chronic sinusitis, unspecified: Secondary | ICD-10-CM | POA: Diagnosis not present
